# Patient Record
Sex: Male | Born: 1935 | Hispanic: No | Smoking: Former smoker
Health system: Southern US, Community
[De-identification: ages and names within clinical notes are randomized; demographics above are authoritative.]

## PROBLEM LIST (undated history)

## (undated) DIAGNOSIS — I4892 Unspecified atrial flutter: Secondary | ICD-10-CM

## (undated) DIAGNOSIS — I255 Ischemic cardiomyopathy: Secondary | ICD-10-CM

## (undated) DIAGNOSIS — I2510 Atherosclerotic heart disease of native coronary artery without angina pectoris: Secondary | ICD-10-CM

## (undated) DIAGNOSIS — I10 Essential (primary) hypertension: Secondary | ICD-10-CM

## (undated) DIAGNOSIS — E079 Disorder of thyroid, unspecified: Secondary | ICD-10-CM

## (undated) DIAGNOSIS — I509 Heart failure, unspecified: Secondary | ICD-10-CM

## (undated) DIAGNOSIS — K922 Gastrointestinal hemorrhage, unspecified: Secondary | ICD-10-CM

## (undated) DIAGNOSIS — E119 Type 2 diabetes mellitus without complications: Secondary | ICD-10-CM

## (undated) DIAGNOSIS — I471 Supraventricular tachycardia, unspecified: Secondary | ICD-10-CM

## (undated) DIAGNOSIS — I517 Cardiomegaly: Secondary | ICD-10-CM

## (undated) DIAGNOSIS — I4891 Unspecified atrial fibrillation: Secondary | ICD-10-CM

## (undated) DIAGNOSIS — I447 Left bundle-branch block, unspecified: Secondary | ICD-10-CM

## (undated) HISTORY — PX: OTHER SURGICAL HISTORY: SHX169

## (undated) HISTORY — PX: ANGIOPLASTY: SHX39

---

## 2015-01-29 HISTORY — PX: CARDIAC CATHETERIZATION: SHX172

## 2015-05-19 ENCOUNTER — Emergency Department (HOSPITAL_COMMUNITY): Payer: Medicare Other

## 2015-05-19 ENCOUNTER — Encounter (HOSPITAL_COMMUNITY): Payer: Self-pay | Admitting: *Deleted

## 2015-05-19 ENCOUNTER — Observation Stay (HOSPITAL_COMMUNITY)
Admission: EM | Admit: 2015-05-19 | Discharge: 2015-05-20 | Disposition: A | Discharge status: Home / Self Care | Payer: Medicare Other | Attending: Internal Medicine | Admitting: Internal Medicine

## 2015-05-19 DIAGNOSIS — Z7984 Long term (current) use of oral hypoglycemic drugs: Secondary | ICD-10-CM | POA: Insufficient documentation

## 2015-05-19 DIAGNOSIS — I5042 Chronic combined systolic (congestive) and diastolic (congestive) heart failure: Secondary | ICD-10-CM | POA: Diagnosis not present

## 2015-05-19 DIAGNOSIS — E119 Type 2 diabetes mellitus without complications: Secondary | ICD-10-CM | POA: Diagnosis not present

## 2015-05-19 DIAGNOSIS — E039 Hypothyroidism, unspecified: Secondary | ICD-10-CM | POA: Diagnosis not present

## 2015-05-19 DIAGNOSIS — I255 Ischemic cardiomyopathy: Secondary | ICD-10-CM | POA: Insufficient documentation

## 2015-05-19 DIAGNOSIS — I2510 Atherosclerotic heart disease of native coronary artery without angina pectoris: Secondary | ICD-10-CM | POA: Diagnosis not present

## 2015-05-19 DIAGNOSIS — Z87891 Personal history of nicotine dependence: Secondary | ICD-10-CM | POA: Diagnosis not present

## 2015-05-19 DIAGNOSIS — Z7902 Long term (current) use of antithrombotics/antiplatelets: Secondary | ICD-10-CM | POA: Diagnosis not present

## 2015-05-19 DIAGNOSIS — I4891 Unspecified atrial fibrillation: Secondary | ICD-10-CM | POA: Diagnosis not present

## 2015-05-19 DIAGNOSIS — I110 Hypertensive heart disease with heart failure: Secondary | ICD-10-CM | POA: Diagnosis not present

## 2015-05-19 DIAGNOSIS — I447 Left bundle-branch block, unspecified: Secondary | ICD-10-CM | POA: Insufficient documentation

## 2015-05-19 DIAGNOSIS — Z79899 Other long term (current) drug therapy: Secondary | ICD-10-CM | POA: Diagnosis not present

## 2015-05-19 DIAGNOSIS — I509 Heart failure, unspecified: Secondary | ICD-10-CM | POA: Insufficient documentation

## 2015-05-19 DIAGNOSIS — R000 Tachycardia, unspecified: Secondary | ICD-10-CM | POA: Diagnosis present

## 2015-05-19 DIAGNOSIS — Z955 Presence of coronary angioplasty implant and graft: Secondary | ICD-10-CM | POA: Insufficient documentation

## 2015-05-19 DIAGNOSIS — Z8679 Personal history of other diseases of the circulatory system: Secondary | ICD-10-CM

## 2015-05-19 DIAGNOSIS — R002 Palpitations: Secondary | ICD-10-CM | POA: Diagnosis present

## 2015-05-19 HISTORY — DX: Heart failure, unspecified: I50.9

## 2015-05-19 HISTORY — DX: Ischemic cardiomyopathy: I25.5

## 2015-05-19 HISTORY — DX: Atherosclerotic heart disease of native coronary artery without angina pectoris: I25.10

## 2015-05-19 HISTORY — DX: Essential (primary) hypertension: I10

## 2015-05-19 HISTORY — DX: Cardiomegaly: I51.7

## 2015-05-19 HISTORY — DX: Supraventricular tachycardia, unspecified: I47.10

## 2015-05-19 HISTORY — DX: Disorder of thyroid, unspecified: E07.9

## 2015-05-19 HISTORY — DX: Gastrointestinal hemorrhage, unspecified: K92.2

## 2015-05-19 HISTORY — DX: Unspecified atrial fibrillation: I48.91

## 2015-05-19 HISTORY — DX: Left bundle-branch block, unspecified: I44.7

## 2015-05-19 HISTORY — DX: Supraventricular tachycardia: I47.1

## 2015-05-19 HISTORY — DX: Unspecified atrial flutter: I48.92

## 2015-05-19 HISTORY — DX: Type 2 diabetes mellitus without complications: E11.9

## 2015-05-19 LAB — CBC WITH DIFFERENTIAL/PLATELET
Basophils Absolute: 0 10*3/uL (ref 0.0–0.1)
Basophils Relative: 1 %
Eosinophils Absolute: 0.2 10*3/uL (ref 0.0–0.7)
Eosinophils Relative: 3 %
HCT: 42 % (ref 39.0–52.0)
HEMOGLOBIN: 14.4 g/dL (ref 13.0–17.0)
LYMPHS ABS: 2.2 10*3/uL (ref 0.7–4.0)
LYMPHS PCT: 29 %
MCH: 30.4 pg (ref 26.0–34.0)
MCHC: 34.3 g/dL (ref 30.0–36.0)
MCV: 88.8 fL (ref 78.0–100.0)
MONO ABS: 0.4 10*3/uL (ref 0.1–1.0)
MONOS PCT: 6 %
NEUTROS ABS: 4.6 10*3/uL (ref 1.7–7.7)
NEUTROS PCT: 61 %
Platelets: 145 10*3/uL — ABNORMAL LOW (ref 150–400)
RBC: 4.73 MIL/uL (ref 4.22–5.81)
RDW: 16.2 % — AB (ref 11.5–15.5)
WBC: 7.4 10*3/uL (ref 4.0–10.5)

## 2015-05-19 LAB — BRAIN NATRIURETIC PEPTIDE: B Natriuretic Peptide: 689 pg/mL — ABNORMAL HIGH (ref 0.0–100.0)

## 2015-05-19 LAB — COMPREHENSIVE METABOLIC PANEL
ALK PHOS: 62 U/L (ref 38–126)
ALT: 32 U/L (ref 17–63)
ANION GAP: 11 (ref 5–15)
AST: 40 U/L (ref 15–41)
Albumin: 4 g/dL (ref 3.5–5.0)
BUN: 24 mg/dL — ABNORMAL HIGH (ref 6–20)
CALCIUM: 9.2 mg/dL (ref 8.9–10.3)
CO2: 26 mmol/L (ref 22–32)
Chloride: 103 mmol/L (ref 101–111)
Creatinine, Ser: 1 mg/dL (ref 0.61–1.24)
GFR calc Af Amer: >60 mL/min (ref >60)
GLUCOSE: 183 mg/dL — AB (ref 65–99)
Potassium: 3.8 mmol/L (ref 3.5–5.1)
Sodium: 140 mmol/L (ref 135–145)
TOTAL PROTEIN: 6.5 g/dL (ref 6.5–8.1)
Total Bilirubin: 1 mg/dL (ref 0.3–1.2)

## 2015-05-19 LAB — MAGNESIUM: MAGNESIUM: 1.6 mg/dL — AB (ref 1.7–2.4)

## 2015-05-19 LAB — TROPONIN I: TROPONIN I: 0.04 ng/mL — AB (ref <0.031)

## 2015-05-19 MED ORDER — AMIODARONE HCL IN DEXTROSE 360-4.14 MG/200ML-% IV SOLN
60.0000 mg/h | INTRAVENOUS | Status: DC
  Filled 2015-05-19: qty 200

## 2015-05-19 MED ORDER — MAGNESIUM SULFATE 2 GM/50ML IV SOLN
2.0000 g | Freq: Once | INTRAVENOUS | Status: AC
  Administered 2015-05-19: 2 g via INTRAVENOUS
  Filled 2015-05-19: qty 50

## 2015-05-19 MED ORDER — AMIODARONE HCL IN DEXTROSE 360-4.14 MG/200ML-% IV SOLN: 30.0000 mg/h | INTRAVENOUS | Status: DC

## 2015-05-19 MED ORDER — AMIODARONE LOAD VIA INFUSION
150.0000 mg | Freq: Once | INTRAVENOUS | Status: DC
  Filled 2015-05-19: qty 83.34

## 2015-05-19 NOTE — ED Provider Notes (Signed)
CSN: [id number]     Arrival date & time 05/19/15  1954 History   First MD Initiated Contact with Patient 05/19/15 2014     Chief Complaint  Patient presents with  . Chest Pain     HPI  Pt was seen at 2015. Per pt, c/o gradual onset and persistence of constant "funny sensation" in his mid-chest since approximately 1830 PTA. Has been associated with diaphoresis and SOB. Pt states he was evaluated at a local [facility], and was told to go to a local ED for further evaluation. Pt endorses hx CHF and afib.  Denies chest pain, no palpitations, no cough, no back pain, no abd pain, no N/V/D, no fevers.    Receives care at [facility] in [place] Past Medical History  Diagnosis Date  . CHF (congestive heart failure) (HCC)   . Hypertension   . Diabetes mellitus without complication (HCC)   . Thyroid disease   . Atrial fibrillation (HCC)   . History of Wolff-Parkinson-White (WPW) syndrome     s/p ablation  . Coronary artery disease   . GI bleed    Past Surgical History  Procedure Laterality Date  . Angioplasty    . Heart ablation      for WPW  . Stents    . Cardiac catheterization  01/2015    [name]    Social History  Substance Use Topics  . Smoking status: Former Games developer  . Smokeless tobacco: None  . Alcohol Use: No    Review of Systems ROS: Statement: All systems negative except as marked or noted in the HPI; Constitutional: Negative for fever and chills. ; ; Eyes: Negative for eye pain, redness and discharge. ; ; ENMT: Negative for ear pain, hoarseness, nasal congestion, sinus pressure and sore throat. ; ; Cardiovascular: +SOB, diaphoresis. Negative for chest pain, palpitations, and peripheral edema. ; ; Respiratory: Negative for cough, wheezing and stridor. ; ; Gastrointestinal: Negative for nausea, vomiting, diarrhea, abdominal pain, blood in stool, hematemesis, jaundice and rectal bleeding. . ; ; Genitourinary: Negative for dysuria, flank pain and hematuria. ; ;  Musculoskeletal: Negative for back pain and neck pain. Negative for swelling and trauma.; ; Skin: Negative for pruritus, rash, abrasions, blisters, bruising and skin lesion.; ; Neuro: Negative for headache, lightheadedness and neck stiffness. Negative for weakness, altered level of consciousness , altered mental status, extremity weakness, paresthesias, involuntary movement, seizure and syncope.      Allergies  Pradaxa and Sulfa antibiotics  Home Medications   Prior to Admission medications   Medication Sig Start Date End Date Taking? Authorizing Provider  amLODipine (NORVASC) 2.5 MG tablet Take 2.5 mg by mouth every morning.   Yes Historical Provider, MD  atorvastatin (LIPITOR) 40 MG tablet Take 40 mg by mouth every evening.   Yes Historical Provider, MD  clopidogrel (PLAVIX) 75 MG tablet Take 75 mg by mouth every morning.   Yes Historical Provider, MD  furosemide (LASIX) 40 MG tablet Take 40 mg by mouth 2 (two) times daily.   Yes Historical Provider, MD  isosorbide mononitrate (IMDUR) 60 MG 24 hr tablet Take 60 mg by mouth every morning.   Yes Historical Provider, MD  levothyroxine (SYNTHROID, LEVOTHROID) 100 MCG tablet Take 100 mcg by mouth daily before breakfast.   Yes Historical Provider, MD  lisinopril (PRINIVIL,ZESTRIL) 40 MG tablet Take 40 mg by mouth every morning.   Yes Historical Provider, MD  metFORMIN (GLUCOPHAGE) 500 MG tablet Take 500 mg by mouth 2 (two) times daily  with a meal.   Yes Historical Provider, MD  metoprolol (LOPRESSOR) 100 MG tablet Take 100 mg by mouth 2 (two) times daily.   Yes Historical Provider, MD  Multiple Vitamins-Minerals (CENTRUM PO) Take 1 tablet by mouth daily.   Yes Historical Provider, MD  vitamin C (ASCORBIC ACID) 500 MG tablet Take 500 mg by mouth daily.   Yes Historical Provider, MD   BP 119/69 mmHg  Pulse 72  Temp(Src) 97.6 F (36.4 C) (Oral)  Resp 19  Ht 5\' 9"  (1.753 m)  Wt 155 lb (70.308 kg)  BMI 22.88 kg/m2  SpO2 100% Physical Exam   2020: Physical examination:  Nursing notes reviewed; Vital signs and O2 SAT reviewed;  Constitutional: Well developed, Well nourished, Well hydrated, In no acute distress; Head:  Normocephalic, atraumatic; Eyes: EOMI, PERRL, No scleral icterus; ENMT: Mouth and pharynx normal, Mucous membranes moist; Neck: Supple, Full range of motion, No lymphadenopathy; Cardiovascular: Tachycardic rate and irregular rhythm, No gallop; Respiratory: Breath sounds clear & equal bilaterally, No wheezes.  Speaking full sentences with ease, Normal respiratory effort/excursion; Chest: Nontender, Movement normal; Abdomen: Soft, Nontender, Nondistended, Normal bowel sounds; Genitourinary: No CVA tenderness; Extremities: Pulses normal, No tenderness, +1 pedal edema bilat. No calf asymmetry.; Neuro: AA&Ox3, Major CN grossly intact.  Speech clear. No gross focal motor or sensory deficits in extremities.; Skin: Color normal, Warm, Dry.   ED Course  Procedures (including critical care time) Labs Review   Imaging Review  I have personally reviewed and evaluated these images and lab results as part of my medical decision-making.   EKG Interpretation   Date/Time:  Friday May 19 2015 20:07:11 EDT Ventricular Rate:  178 PR Interval:  143 QRS Duration: 149 QT Interval:  297 QTC Calculation: 511 R Axis:   -79 Text Interpretation:  Extreme tachycardia with wide complex, no further  rhythm analysis attempted No old tracing to compare Confirmed by [facility]   MD, Nicholos JohnsKATHLEEN [phone]([id number]) on 05/19/2015 10:17:56 PM      EKG Interpretation  Date/Time:  Friday May 19 2015 20:16:32 EDT Ventricular Rate:  105 PR Interval:  57 QRS Duration: 149 QT Interval:  395 QTC Calculation: 522 R Axis:   -57 Text Interpretation:  Atrial fibrillation Premature ventricular complexes Short PR interval Consider right atrial enlargement Left axis deviation Left bundle branch block Since last tracing of earlier today Rate slower Confirmed by  [facility]  MD, Nicholos JohnsKATHLEEN [phone]) on 05/19/2015 10:19:13 PM        MDM  MDM Reviewed: previous chart, nursing note and vitals Interpretation: labs, ECG and x-ray Total time providing critical care: 30-74 minutes. This excludes time spent performing separately reportable procedures and services. Consults: admitting MD     CRITICAL CARE Performed by: Laray AngerMCMANUS,Haislee Corso M Total critical care time: 35 minutes Critical care time was exclusive of separately billable procedures and treating other patients. Critical care was necessary to treat or prevent imminent or life-threatening deterioration. Critical care was time spent personally by me on the following activities: development of treatment plan with patient and/or surrogate as well as nursing, discussions with consultants, evaluation of patient's response to treatment, examination of patient, obtaining history from patient or surrogate, ordering and performing treatments and interventions, ordering and review of laboratory studies, ordering and review of radiographic studies, pulse oximetry and re-evaluation of patient's condition.   Results for orders placed or performed during the hospital encounter of 05/19/15  CBC with Differential  Result Value Ref Range   WBC 7.4 4.0 - 10.5 K/uL  RBC 4.73 4.22 - 5.81 MIL/uL   Hemoglobin 14.4 13.0 - 17.0 g/dL   HCT 16.1 09.6 - 04.5 %   MCV 88.8 78.0 - 100.0 fL   MCH 30.4 26.0 - 34.0 pg   MCHC 34.3 30.0 - 36.0 g/dL   RDW 40.9 (H) 81.1 - 91.4 %   Platelets 145 (L) 150 - 400 K/uL   Neutrophils Relative % 61 %   Neutro Abs 4.6 1.7 - 7.7 K/uL   Lymphocytes Relative 29 %   Lymphs Abs 2.2 0.7 - 4.0 K/uL   Monocytes Relative 6 %   Monocytes Absolute 0.4 0.1 - 1.0 K/uL   Eosinophils Relative 3 %   Eosinophils Absolute 0.2 0.0 - 0.7 K/uL   Basophils Relative 1 %   Basophils Absolute 0.0 0.0 - 0.1 K/uL  Comprehensive metabolic panel  Result Value Ref Range   Sodium 140 135 - 145 mmol/L   Potassium  3.8 3.5 - 5.1 mmol/L   Chloride 103 101 - 111 mmol/L   CO2 26 22 - 32 mmol/L   Glucose, Bld 183 (H) 65 - 99 mg/dL   BUN 24 (H) 6 - 20 mg/dL   Creatinine, Ser 7.82 0.61 - 1.24 mg/dL   Calcium 9.2 8.9 - 95.6 mg/dL   Total Protein 6.5 6.5 - 8.1 g/dL   Albumin 4.0 3.5 - 5.0 g/dL   AST 40 15 - 41 U/L   ALT 32 17 - 63 U/L   Alkaline Phosphatase 62 38 - 126 U/L   Total Bilirubin 1.0 0.3 - 1.2 mg/dL   GFR calc non Af Amer >60 >60 mL/min   GFR calc Af Amer >60 >60 mL/min   Anion gap 11 5 - 15  Troponin I  Result Value Ref Range   Troponin I 0.04 (H) <0.031 ng/mL  Brain natriuretic peptide  Result Value Ref Range   B Natriuretic Peptide 689.0 (H) 0.0 - 100.0 pg/mL  Magnesium  Result Value Ref Range   Magnesium 1.6 (L) 1.7 - 2.4 mg/dL    Dg Chest Port 1 View 05/19/2015  CLINICAL DATA:  Palpitations and diaphoresis. EXAM: PORTABLE CHEST 1 VIEW COMPARISON:  None. FINDINGS: Slightly shallow inspiration. Cardiac enlargement without vascular congestion. No focal airspace disease or consolidation in the lungs. No blunting of costophrenic angles. No pneumothorax. Calcification of the aorta. IMPRESSION: Cardiac enlargement.  No evidence of active pulmonary disease. Electronically Signed   By: Burman Nieves M.D.   On: 05/19/2015 22:24    2230:  On arrival: pt's with wide complex tachycardia, LBBB, no old EKG to compare. As I was evaluating pt, his monitor spontaneously changed to afib with PVC's, rate 80's. Pt stated he "felt better" after this. Pt's monitor again briefly again showed HR 120's, wide complex tachycardia; but this also quickly spontaneously resolved once again before any meds were given. Pt has remained afib, rate 70-80's since this time. Continues to endorse he "feels better."  IV magnesium given for low magnesium level.  Pt already has taken plavix today (is only on plavix due to hx GI bleed per Care Everywhere records). Dx and testing d/w pt and family.  Questions answered.  Verb  understanding, agreeable to admit.  T/C to [facility] Dr. Onalee Hua, case discussed, including:  HPI, pertinent PM/SHx, VS/PE, dx testing, ED course and treatment:  Request to call [facility] Cards MD to admit. T/C to [facility] Cards Fellow Dr. Sandy Salaam, case discussed, including:  HPI, pertinent PM/SHx, VS/PE, dx testing, ED course and treatment:  requests to have [facility] admit to [facility] under their service and Cards will consult; he has found in Care Everywhere records that LBBB is pt's baseline. T/C to [facility] Dr. Onalee Hua, case discussed, including:  HPI, pertinent PM/SHx, VS/PE, dx testing, ED course and treatment:  Agreeable to facilitate transfer/admit to [facility], requests to write temporary orders, obtain observation tele bed to team [facility].    Samuel Jester, DO 05/22/15 0107

## 2015-05-19 NOTE — ED Notes (Signed)
Daughter Cristino Marteserry Witt[place] cell [phone].

## 2015-05-19 NOTE — ED Notes (Signed)
[place] today and pt reports that about 7pm he began having a "funny" sensation in the center of his chest and diaphoretic. Pt reports he is feeling better now but had stopped at an Urgent Care and they recommended him to come to [facility] ED.

## 2015-05-20 ENCOUNTER — Encounter (HOSPITAL_COMMUNITY): Payer: Self-pay | Admitting: Internal Medicine

## 2015-05-20 DIAGNOSIS — I471 Supraventricular tachycardia: Secondary | ICD-10-CM | POA: Diagnosis not present

## 2015-05-20 DIAGNOSIS — R002 Palpitations: Secondary | ICD-10-CM

## 2015-05-20 DIAGNOSIS — E039 Hypothyroidism, unspecified: Secondary | ICD-10-CM | POA: Diagnosis not present

## 2015-05-20 DIAGNOSIS — I447 Left bundle-branch block, unspecified: Secondary | ICD-10-CM | POA: Diagnosis not present

## 2015-05-20 DIAGNOSIS — I4892 Unspecified atrial flutter: Secondary | ICD-10-CM | POA: Diagnosis not present

## 2015-05-20 DIAGNOSIS — E119 Type 2 diabetes mellitus without complications: Secondary | ICD-10-CM

## 2015-05-20 DIAGNOSIS — I481 Persistent atrial fibrillation: Secondary | ICD-10-CM

## 2015-05-20 DIAGNOSIS — I25119 Atherosclerotic heart disease of native coronary artery with unspecified angina pectoris: Secondary | ICD-10-CM

## 2015-05-20 DIAGNOSIS — Z87891 Personal history of nicotine dependence: Secondary | ICD-10-CM | POA: Diagnosis not present

## 2015-05-20 DIAGNOSIS — I10 Essential (primary) hypertension: Secondary | ICD-10-CM | POA: Diagnosis not present

## 2015-05-20 DIAGNOSIS — R000 Tachycardia, unspecified: Secondary | ICD-10-CM | POA: Diagnosis present

## 2015-05-20 DIAGNOSIS — Z7902 Long term (current) use of antithrombotics/antiplatelets: Secondary | ICD-10-CM | POA: Diagnosis not present

## 2015-05-20 DIAGNOSIS — I4891 Unspecified atrial fibrillation: Secondary | ICD-10-CM | POA: Diagnosis not present

## 2015-05-20 DIAGNOSIS — Z79899 Other long term (current) drug therapy: Secondary | ICD-10-CM | POA: Diagnosis not present

## 2015-05-20 DIAGNOSIS — I110 Hypertensive heart disease with heart failure: Secondary | ICD-10-CM | POA: Diagnosis not present

## 2015-05-20 DIAGNOSIS — I255 Ischemic cardiomyopathy: Secondary | ICD-10-CM | POA: Diagnosis not present

## 2015-05-20 DIAGNOSIS — I5042 Chronic combined systolic (congestive) and diastolic (congestive) heart failure: Secondary | ICD-10-CM | POA: Diagnosis not present

## 2015-05-20 DIAGNOSIS — Z955 Presence of coronary angioplasty implant and graft: Secondary | ICD-10-CM | POA: Diagnosis not present

## 2015-05-20 DIAGNOSIS — Z7984 Long term (current) use of oral hypoglycemic drugs: Secondary | ICD-10-CM | POA: Diagnosis not present

## 2015-05-20 DIAGNOSIS — I2510 Atherosclerotic heart disease of native coronary artery without angina pectoris: Secondary | ICD-10-CM | POA: Diagnosis not present

## 2015-05-20 LAB — URINALYSIS, ROUTINE W REFLEX MICROSCOPIC
Bilirubin Urine: NEGATIVE
GLUCOSE, UA: 250 mg/dL — AB
HGB URINE DIPSTICK: NEGATIVE
Ketones, ur: NEGATIVE mg/dL
Leukocytes, UA: NEGATIVE
Nitrite: NEGATIVE
PH: 5.5 (ref 5.0–8.0)
Protein, ur: 30 mg/dL — AB
SPECIFIC GRAVITY, URINE: 1.026 (ref 1.005–1.030)

## 2015-05-20 LAB — GLUCOSE, CAPILLARY
GLUCOSE-CAPILLARY: 95 mg/dL (ref 65–99)
Glucose-Capillary: 134 mg/dL — ABNORMAL HIGH (ref 65–99)
Glucose-Capillary: 129 mg/dL — ABNORMAL HIGH (ref 65–99)

## 2015-05-20 LAB — COMPREHENSIVE METABOLIC PANEL
ALBUMIN: 3.7 g/dL (ref 3.5–5.0)
ALT: 28 U/L (ref 17–63)
ANION GAP: 12 (ref 5–15)
AST: 32 U/L (ref 15–41)
Alkaline Phosphatase: 60 U/L (ref 38–126)
BILIRUBIN TOTAL: 1 mg/dL (ref 0.3–1.2)
BUN: 17 mg/dL (ref 6–20)
CO2: 26 mmol/L (ref 22–32)
Calcium: 8.6 mg/dL — ABNORMAL LOW (ref 8.9–10.3)
Chloride: 103 mmol/L (ref 101–111)
Creatinine, Ser: 0.88 mg/dL (ref 0.61–1.24)
GFR calc Af Amer: >60 mL/min (ref >60)
GFR calc non Af Amer: >60 mL/min (ref >60)
GLUCOSE: 146 mg/dL — AB (ref 65–99)
POTASSIUM: 3.6 mmol/L (ref 3.5–5.1)
Sodium: 141 mmol/L (ref 135–145)
TOTAL PROTEIN: 6 g/dL — AB (ref 6.5–8.1)

## 2015-05-20 LAB — URINE MICROSCOPIC-ADD ON

## 2015-05-20 LAB — TROPONIN I
TROPONIN I: 0.11 ng/mL — AB (ref <0.031)
TROPONIN I: 0.1 ng/mL — AB (ref <0.031)
Troponin I: 0.1 ng/mL — ABNORMAL HIGH (ref <0.031)

## 2015-05-20 LAB — TSH: TSH: 5.412 u[IU]/mL — ABNORMAL HIGH (ref 0.350–4.500)

## 2015-05-20 LAB — MAGNESIUM: Magnesium: 1.9 mg/dL (ref 1.7–2.4)

## 2015-05-20 MED ORDER — LEVOTHYROXINE SODIUM 100 MCG PO TABS
100.0000 ug | ORAL_TABLET | Freq: Every day | ORAL | Status: DC
  Administered 2015-05-20: 100 ug via ORAL
  Filled 2015-05-20: qty 1

## 2015-05-20 MED ORDER — ACETAMINOPHEN 325 MG PO TABS: 650.0000 mg | ORAL_TABLET | ORAL | Status: DC | PRN

## 2015-05-20 MED ORDER — INSULIN ASPART 100 UNIT/ML ~~LOC~~ SOLN
0.0000 [IU] | Freq: Three times a day (TID) | SUBCUTANEOUS | Status: DC
  Administered 2015-05-20: 1 [IU] via SUBCUTANEOUS

## 2015-05-20 MED ORDER — POTASSIUM CHLORIDE CRYS ER 20 MEQ PO TBCR
30.0000 meq | EXTENDED_RELEASE_TABLET | Freq: Once | ORAL | Status: AC
  Administered 2015-05-20: 30 meq via ORAL
  Filled 2015-05-20: qty 1

## 2015-05-20 MED ORDER — ATORVASTATIN CALCIUM 40 MG PO TABS: 40.0000 mg | ORAL_TABLET | Freq: Every evening | ORAL | Status: DC

## 2015-05-20 MED ORDER — ASPIRIN EC 325 MG PO TBEC
325.0000 mg | DELAYED_RELEASE_TABLET | Freq: Every day | ORAL | Status: DC
  Administered 2015-05-20: 325 mg via ORAL
  Filled 2015-05-20: qty 1

## 2015-05-20 MED ORDER — ONDANSETRON HCL 4 MG/2ML IJ SOLN: 4.0000 mg | Freq: Four times a day (QID) | INTRAMUSCULAR | Status: DC | PRN

## 2015-05-20 MED ORDER — LISINOPRIL 40 MG PO TABS
40.0000 mg | ORAL_TABLET | Freq: Every morning | ORAL | Status: DC
  Administered 2015-05-20: 40 mg via ORAL
  Filled 2015-05-20: qty 1

## 2015-05-20 MED ORDER — FUROSEMIDE 40 MG PO TABS
40.0000 mg | ORAL_TABLET | Freq: Two times a day (BID) | ORAL | Status: DC
  Administered 2015-05-20 (×2): 40 mg via ORAL
  Filled 2015-05-20 (×2): qty 1

## 2015-05-20 MED ORDER — AMIODARONE HCL 200 MG PO TABS: 200.0000 mg | ORAL_TABLET | Freq: Every day | ORAL | Status: AC

## 2015-05-20 MED ORDER — SODIUM CHLORIDE 0.9 % IV SOLN
INTRAVENOUS | Status: AC
  Administered 2015-05-20: 03:00:00 via INTRAVENOUS

## 2015-05-20 MED ORDER — VITAMIN C 500 MG PO TABS
500.0000 mg | ORAL_TABLET | Freq: Every day | ORAL | Status: DC
  Administered 2015-05-20: 500 mg via ORAL
  Filled 2015-05-20: qty 1

## 2015-05-20 MED ORDER — OFF THE BEAT BOOK
Freq: Once | Status: DC
  Filled 2015-05-20: qty 1

## 2015-05-20 MED ORDER — METOPROLOL TARTRATE 100 MG PO TABS
100.0000 mg | ORAL_TABLET | Freq: Two times a day (BID) | ORAL | Status: DC
  Administered 2015-05-20 (×2): 100 mg via ORAL
  Filled 2015-05-20 (×2): qty 1

## 2015-05-20 MED ORDER — AMLODIPINE BESYLATE 5 MG PO TABS
2.5000 mg | ORAL_TABLET | Freq: Every morning | ORAL | Status: DC
  Administered 2015-05-20: 2.5 mg via ORAL
  Filled 2015-05-20: qty 1

## 2015-05-20 MED ORDER — CLOPIDOGREL BISULFATE 75 MG PO TABS
75.0000 mg | ORAL_TABLET | Freq: Every morning | ORAL | Status: DC
  Administered 2015-05-20: 75 mg via ORAL
  Filled 2015-05-20: qty 1

## 2015-05-20 MED ORDER — ISOSORBIDE MONONITRATE ER 60 MG PO TB24
60.0000 mg | ORAL_TABLET | Freq: Every morning | ORAL | Status: DC
Start: 2015-05-20 — End: 2015-05-20
  Administered 2015-05-20: 60 mg via ORAL
  Filled 2015-05-20: qty 1

## 2015-05-20 NOTE — Progress Notes (Signed)
Order received to discharge.  Telemetry removed and CCMD notified.  IV removed with catheter intact.  Discharge education given to Pt with daughter at bedside.  Pt given educational booklet Off the Beat for diagnosis of Afib.  Pt to start on amiodarone and educational handout given on amiodarone.  Pt indicates understanding of presented materials.  Pt denies chest discomfort at this time.  Pt stable at discharge.

## 2015-05-20 NOTE — H&P (Signed)
PCP:   Pcp Not In System   Chief Complaint:  fluttering  HPI: 82 yo male h/o afib/LBBB, chf, htn, hypothyroidism, s/p ablation for unknown arrythmia (pt not aware of dx of WPW) comes in today after experiencing a fluttering sensation in his chest with associated diaphoresis and sob.  There was really no chest pain.  [place] from [place].  He has experienced theses symptoms before and he says it is due to his irregular heartbeat.  Pt HR was over 160, spontaneously resolved in the ED and rate now in the 80s pt asymptomatic at this time.  Pt denies any fever.  No swelling.  Pt referred for admission for his arrythmia.  [facility], [facility], pt aware of this.  Review of Systems:  Positive and negative as per HPI otherwise all other systems are negative  Past Medical History: Past Medical History  Diagnosis Date  . CHF (congestive heart failure) (HCC)   . Hypertension   . Diabetes mellitus without complication (HCC)   . Thyroid disease   . Atrial fibrillation (HCC)   . History of Wolff-Parkinson-White (WPW) syndrome     s/p ablation  . Coronary artery disease   . GI bleed    Past Surgical History  Procedure Laterality Date  . Angioplasty    . Heart ablation      for WPW  . Stents    . Cardiac catheterization  01/2015    [name]    Medications: Prior to Admission medications   Medication Sig Start Date End Date Taking? Authorizing Provider  amLODipine (NORVASC) 2.5 MG tablet Take 2.5 mg by mouth every morning.   Yes Historical Provider, MD  atorvastatin (LIPITOR) 40 MG tablet Take 40 mg by mouth every evening.   Yes Historical Provider, MD  clopidogrel (PLAVIX) 75 MG tablet Take 75 mg by mouth every morning.   Yes Historical Provider, MD  furosemide (LASIX) 40 MG tablet Take 40 mg by mouth 2 (two) times daily.   Yes Historical Provider, MD  isosorbide mononitrate (IMDUR) 60 MG 24 hr tablet Take 60 mg by  mouth every morning.   Yes Historical Provider, MD  levothyroxine (SYNTHROID, LEVOTHROID) 100 MCG tablet Take 100 mcg by mouth daily before breakfast.   Yes Historical Provider, MD  lisinopril (PRINIVIL,ZESTRIL) 40 MG tablet Take 40 mg by mouth every morning.   Yes Historical Provider, MD  metFORMIN (GLUCOPHAGE) 500 MG tablet Take 500 mg by mouth 2 (two) times daily with a meal.   Yes Historical Provider, MD  metoprolol (LOPRESSOR) 100 MG tablet Take 100 mg by mouth 2 (two) times daily.   Yes Historical Provider, MD  Multiple Vitamins-Minerals (CENTRUM PO) Take 1 tablet by mouth daily.   Yes Historical Provider, MD  vitamin C (ASCORBIC ACID) 500 MG tablet Take 500 mg by mouth daily.   Yes Historical Provider, MD    Allergies:   Allergies  Allergen Reactions  . Pradaxa [Dabigatran Etexilate Mesylate] Other (See Comments)    black-tar stools  . Sulfa Antibiotics Hives and Rash    Social History:  reports that he has quit smoking. He does not have any smokeless tobacco history on file. He reports that he does not drink alcohol or use illicit drugs.  Family History: History reviewed. No pertinent family history.  Physical Exam: Filed Vitals:   05/19/15 2230 05/19/15 2300 05/19/15 2330 05/20/15 0000  BP: 141/68 131/64 125/79 140/89  Pulse:  52 88 88 88  Temp:      TempSrc:      Resp: Height:      Weight:      SpO2: 96% 97% 96% 97%   General appearance: alert, cooperative and no distress Head: Normocephalic, without obvious abnormality, atraumatic Eyes: negative Nose: Nares normal. Septum midline. Mucosa normal. No drainage or sinus tenderness. Neck: no JVD and supple, symmetrical, trachea midline Lungs: clear to auscultation bilaterally Heart: regular rate and rhythm, S1, S2 normal, no murmur, click, rub or gallop Abdomen: soft, non-tender; bowel sounds normal; no masses,  no organomegaly Extremities: extremities normal, atraumatic, no cyanosis or edema Pulses: 2+  and symmetric Skin: Skin color, texture, turgor normal. No rashes or lesions Neurologic: Grossly normal    Labs on Admission:   Recent Labs  05/19/15 2026  NA 140  K 3.8  CL 103  CO2 26  GLUCOSE 183*  BUN 24*  CREATININE 1.00  CALCIUM 9.2  MG 1.6*    Recent Labs  05/19/15 2026  AST 40  ALT 32  ALKPHOS 62  BILITOT 1.0  PROT 6.5  ALBUMIN 4.0    Recent Labs  05/19/15 2026  WBC 7.4  NEUTROABS 4.6  HGB 14.4  HCT 42.0  MCV 88.8  PLT 145*    Recent Labs  05/19/15 2026  TROPONINI 0.04*   Radiological Exams on Admission: Dg Chest Port 1 View  05/19/2015  CLINICAL DATA:  Palpitations and diaphoresis. EXAM: PORTABLE CHEST 1 VIEW COMPARISON:  None. FINDINGS: Slightly shallow inspiration. Cardiac enlargement without vascular congestion. No focal airspace disease or consolidation in the lungs. No blunting of costophrenic angles. No pneumothorax. Calcification of the aorta. IMPRESSION: Cardiac enlargement.  No evidence of active pulmonary disease. Electronically Signed   By: Burman Nieves M.D.   On: 05/19/2015 22:24   ekg afib, lbbb  Assessment/Plan  82 yo male with fluttering, mild elevation of troponin h/o afib and lbbb  Principal Problem:   Fluttering sensation of heart- likely afib with rvr.  Rate controlled now.  Will check tsh level.  Pt with low mag, replete.  [facility] for [facility].  Cardiology aware and will be notified upon pt arrival to [facility] tele floor.  Serial trop as mildly elevated.  Aspirin.    Active Problems:   CHF (congestive heart failure) (HCC)- compensated at this time   Diabetes mellitus without complication (HCC)- ssi   Atrial fibrillation (HCC)- noted, rate good control at this moment   History of Wolff-Parkinson-White (WPW) syndrome- pt unaware of this diagnosis   Atrial fibrillation with rapid ventricular response (HCC)   Hypomagnesemia- replete  obs on tele.  Full code.  Elanora Quin A 05/20/2015,  12:35 AM

## 2015-05-20 NOTE — Progress Notes (Signed)
Patient seen and examined this morning, H&P reviewed and cardiology consult note. Briefly this is a pleasant and highly functioning 82 yo M with history of CAD, chronic systolic CHF, PAF not anticoagulated due to GI bleeding, admitted with palpitations, lightheadedness, dyspnea and chest discomfort. [facility] and [facility]    Chest pain / WCT / A fib / flutter / CAD - cardiology to see, appreciate input - patient asymptomatic this morning - bradycardic episode overnight, asymptomatic - troponins essentially flat - continue Aspirin / Plavix - CHADs2VASc score of 6, prior GI bleed  HLD - continue Lipitor  Chronic systolic CHF - compensated, continue ACEI, Metoprolol  DM - SSI  Morning labs pending  Irisa Grimsley M. Elvera LennoxGherghe, MD [facility] [phone]

## 2015-05-20 NOTE — Consult Note (Signed)
ELECTROPHYSIOLOGY CONSULT NOTE    Primary Care Physician: Pcp Not In System Referring Physician:  Dr Elvera Lennox  Admit Date: 05/19/2015  Reason for consultation:  WCT   James Evans is a 82 y.o. male with a h/o chronic systolic dysfunction, severe LA enlargement, atrial fibrillation, and LBBB.  He is admitted with atrial flutter with RVR.  He reports having an ablation (does not know what kind) about 5 years ago.  He has done well but carries a h/o afib.  He tried pradaxa previously in the setting of antiplatelet therapy and had GI bleeding.  Pradaxa was stopped and he was continued on antiplatelet therapy.  He appears to be in afib most of the time.  He is traveling to [facility] from [place] to visit his family.  He is very functional.  He reports that yesterday he had palpitations and an "awareness" in his chest.  He has associated SOB.  He did not have CP.   He presented to [facility]. In the ED at that facility he was noted to have intermittent runs of WCT, some with HR>180bpm, and with recurrent symptoms during these times. He was otherwise hemodynamically stable and asymptomatic, but given his cardiac history he was transferred to [facility] for further evaluation.  Mr. Wiswell is currently resting comfortably in bed and has no acute complaints. He denies any recent presyncopal or syncopal events.  He is now back to his baseline health state.  Today, he denies symptoms of palpitations, chest pain, shortness of breath, orthopnea, PND, lower extremity edema, dizziness, presyncope, syncope, or neurologic sequela. The patient is tolerating medications without difficulties and is otherwise without complaint today.   Past Medical History  Diagnosis Date  . CHF (congestive heart failure) (HCC)   . Hypertension   . Diabetes mellitus without complication (HCC)   . Thyroid disease   . Atrial fibrillation (HCC)   . History of Wolff-Parkinson-White (WPW) syndrome     s/p  ablation  . Coronary artery disease   . GI bleed    Past Surgical History  Procedure Laterality Date  . Angioplasty    . Heart ablation      for WPW  . Stents    . Cardiac catheterization  01/2015    [place]    . amLODipine  2.5 mg Oral q morning - 10a  . atorvastatin  40 mg Oral QPM  . clopidogrel  75 mg Oral q morning - 10a  . furosemide  40 mg Oral BID  . insulin aspart  0-9 Units Subcutaneous TID WC  . isosorbide mononitrate  60 mg Oral q morning - 10a  . levothyroxine  100 mcg Oral QAC breakfast  . lisinopril  40 mg Oral q morning - 10a  . metoprolol  100 mg Oral BID  . vitamin C  500 mg Oral Daily      Allergies  Allergen Reactions  . Pradaxa [Dabigatran Etexilate Mesylate] Other (See Comments)    black-tar stools  . Sulfa Antibiotics Hives and Rash    Social History   Social History  . Marital Status: Widowed    Spouse Name: N/A  . Number of Children: N/A  . Years of Education: N/A   Occupational History  . Not on file.   Social History Main Topics  . Smoking status: Former Games developer  . Smokeless tobacco: Not on file  . Alcohol Use: No  . Drug Use: No  . Sexual Activity: Not on file   Other  Topics Concern  . Not on file   Social History Narrative  . No narrative on file   FH- HTN  ROS- All systems are reviewed and negative except as per the HPI above  Physical Exam: Telemetry: Filed Vitals:   05/20/15 0000 05/20/15 0030 05/20/15 0206 05/20/15 1247  BP: 140/89 151/84 154/83 115/82  Pulse: 88 87 87 76  Temp:   97.3 F (36.3 C) 98.7 F (37.1 C)  TempSrc:   Oral Oral  Resp: Height:    (1.753 m)   Weight:   153 lb 14.4 oz (69.809 kg)   SpO2: 97% 96% 98% 100%    GEN- The patient is well appearing, alert and oriented x 3 today.   Head- normocephalic, atraumatic Eyes-  Sclera clear, conjunctiva pink Ears- hearing intact Oropharynx- clear Neck- supple,   Lungs- Clear to ausculation bilaterally, normal work of  breathing Heart- irregular rate and rhythm, no murmurs, rubs or gallops, PMI not laterally displaced GI- soft, NT, ND, + BS Extremities- no clubbing, cyanosis, or edema MS- no significant deformity or atrophy Skin- no rash or lesion Psych- euthymic mood, full affect Neuro- strength and sensation are intact   Echo [employer] [facility]) 02/11/2014:  Moderately dilated left ventricle. Severe left ventricular dysfuncion. Estimated ejection fraction is (conduction delay). Overall- there is a severe global hypokinesis. Mild concentric left ventricular hypertrophy. Severe left atrial enlargement. .Right ventricle and right atrium are mildly enlarged . Right ventricle is mildly hypokinetic. Mitral valve leaflets are thickened. There is a moderate, centrally directing mitral regurgitation. Calcified aortic valve with a moderate aortic regurgitation. Moderate tricuspid and pulmonic regurgitation. Moderate pulmonary hypertension. Aortic root and ascending aorta are mildly enlarged. Mild ascites noted.    Coronary Angiography [facility]) 02/10/2015 Coronary Angiography:   1. Left Main Trunk (LMT): Large caliber vessel with mild calcification and diffuse 10% distal stenosis  2. Left Anterior Descending Artery (LAD): Medium caliber calcified vessel with proximal diffuse 20-30% stenosis. The mid LAD has a widely patent previously placed stent with mild in-stent restenosis. The LAD beyond the stent has a discrete 40% stenosis followed by luminal irregularities in a transapical vessel. Diagonal 1 is a small caliber branching vessel that is jailed by the previous placed stent and has a severe 80% ostial stenosis.   3. Left Circumflex Artery (LCx): Medium caliber calcified proximal vessel with diffuse stenosis ranging from 20-40%. Just distal to the takeoff of OM 2 in the AV groove portion of the circumflex there is a discrete 50-60% stenosis. OM 2 is a medium caliber branching  vessel with luminal irregularities.  4. Right Coronary Artery (RCA): Proximal to mid vessel has mild calcification with diffuse 10-30% stenosis. The RPDA is small caliber with luminal irregularities throughout   EKG reveal typical appearing atrial flutter with RVR, LBBB  Labs:   Lab Results  Component Value Date   WBC 7.4 05/19/2015   HGB 14.4 05/19/2015   HCT 42.0 05/19/2015   MCV 88.8 05/19/2015   PLT 145* 05/19/2015     Recent Labs Lab 05/20/15 0745  NA 141  K 3.6  CL 103  CO2 26  BUN 17  CREATININE 0.88  CALCIUM 8.6*  PROT 6.0*  BILITOT 1.0  ALKPHOS 60  ALT 28  AST 32  GLUCOSE 146*   Lab Results  Component Value Date   TROPONINI 0.10* 05/20/2015   No results found for: CHOL No results found for: HDL No results  found for: LDLCALC No results found for: TRIG No results found for: CHOLHDL No results found for: LDLDIRECT    ASSESSMENT AND PLAN:   1. Wide complex tachycardia Due to atrial flutter with RVR The patient has chronic issues with atrial arrhythmias and atrial enlargement.  I anticipate that he will continue to have atrial arrhythmias going forward.  As he is not anticoagulated, rate control is our only real option at this point.  Given LA enlargement and known afib, I am not convinced that he would benefit from ablation. He is now better rate controlled. chads2vasc score is at least 6.  Today, we discussed eliquis as a reasonable option.  If we were to start anticoagulation, I would advise that we first stop plavix.  He is clear in his decision to decline initiation of anticoagualtion.  He states that he wishes to discuss this further with his primary cardiologist upon return to [place].  He is aware of stroke risks.  For rate control in the interim, I will start amiodarone 200mg  daily.  He is aware of potential for liver/thyroid/ lung toxicity and wishes to proceed.  I have instructed him to follow-up with his primary cardiologist in 2 weeks (upon  return to [place] for further direction).  2. CAD No ischemic symptoms presently Mild elevation in troponin is likely due to demand ischemia Cath from January reviewed No indication for further CV tesing here  3. Ischemic CM/ chronic systolic dysfunction euvolemic Resume home medicines Avoid salt, particularly while traveling  4. HTN Stable No change required today  As he appears stable, OK to discharge from EP standpoint.  I have offered follow-up in AF clinic this coming Tuesday which he declines.  He states that he would prefer to be seen by his primary cardiologist and says that he in fact has an appointment in 2 weeks.  I have given him the phone number to the AF clinic and have instructed him to call the clinic should any problems arise.  Electrophysiology team to see as needed while here. Please call with questions.    Hillis RangeJames Enrico Eaddy, MD 05/20/2015  4:24 PM

## 2015-05-20 NOTE — Progress Notes (Signed)
[facility] hospitalist progress note. Chief complaint. Transfer note. History of present illness. This 82 year old male presented to [facility] emergency room with complaints of heart fluttering. Patient likely A. fib with RVR. Case discussed with cardiology requested patient be transferred to [facility] for further cardiology evaluation. Patient is now arrived in transfer and I'm seeing him at bedside to ensure he remains clinically stable and that his orders transferred here appropriately. Patient has no current medical complaints. Physical exam. Vital signs. Temperature 97.3, pulse 87, respirations 17, blood pressure 154/83. O2 sats 98%. General appearance. Well-developed elderly male who is alert and in no distress. Cardiac. Rate and rhythm regular. Lungs. Breath sounds clear and equal. Abdomen. Soft with positive bowel sounds. Impression/plan. Problem #1. Congestive heart failure. Compensated at this time. Problem #2. Diabetes. Follow with sliding-scale. Problem #3. Atrial fib with RVR. Rate currently controlled. Problem #4. History Wolf Parkinson White syndrome. Problem #5. Hypomagnesemia. Replete. Patient appears clinically stable post transfer. All orders appear to of transferred here appropriately.

## 2015-05-20 NOTE — Consult Note (Signed)
CARDIOLOGY CONSULT NOTE   Patient ID: Donat Humble MRN: [id number] DOB/AGE: 1927/03/03 82 y.o.  Admit date: 05/19/2015  Requesting Physician: Tarry Kos Primary Physician:   Pcp Not In System Reason for Consultation:   WCT  HPI: Navin Dogan is a 82 y.o. 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 The pt is a very high-functioning 82yo, and he had been in his normal state of health until the afternoon of presentation when he experienced acute onset dyspnea and an "odd feeling" in his chest that was not a frank pain.  He presented to a local urgent care facility for evaluation but his symptoms had by then resolved and a preliminary w/u was negative.  In the car ride from the urgent care facility he experienced recurrent symptoms and subsequently presented to [facility] for evaluation.  In the ED at that facility he was noted to have intermittent runs of WCT, some with HR>180bpm, and with recurrent symptoms during these times.  He was otherwise hemodynamically stable and asymptomatic, but given his cardiac history he was transferred to [facility] for further evaluation.  Mr. Doubleday is currently resting comfortably in bed and has no acute complaints.  He denies any recent presyncopal or syncopal events, and has otherwise been feeling well prior to the onset of his presenting symptoms.  The patient's previous cardiac workup and diagnosis, including those documented above, were obtained with the Care Everyone feature with documentation from [facility] in [place].   Past Medical History  Diagnosis Date  . CHF (congestive heart failure) (HCC)   . Hypertension   . Diabetes mellitus  without complication (HCC)   . Thyroid disease   . Atrial fibrillation (HCC)   . History of Wolff-Parkinson-White (WPW) syndrome     s/p ablation  . Coronary artery disease   . GI bleed      Past Surgical History  Procedure Laterality Date  . Angioplasty    . Heart ablation      for WPW  . Stents    . Cardiac catheterization  01/2015    [name]    Allergies  Allergen Reactions  . Pradaxa [Dabigatran Etexilate Mesylate] Other (See Comments)    black-tar stools  . Sulfa Antibiotics Hives and Rash    I have reviewed the patient's current medications . sodium chloride   Intravenous STAT  . amLODipine  2.5 mg Oral q morning - 10a  . aspirin EC  325 mg Oral Daily  . atorvastatin  40 mg Oral QPM  . clopidogrel  75 mg Oral q morning - 10a  . furosemide  40 mg Oral BID  . isosorbide mononitrate  60 mg Oral q morning - 10a  . levothyroxine  100 mcg Oral QAC breakfast  . lisinopril  40 mg Oral q morning - 10a  . metoprolol  100 mg Oral BID  . vitamin C  500 mg Oral Daily     acetaminophen, ondansetron (ZOFRAN) IV  Prior to Admission medications   Medication Sig Start Date End Date Taking? Authorizing Provider  amLODipine (NORVASC) 2.5 MG tablet Take 2.5 mg by mouth every morning.   Yes Historical Provider, MD  atorvastatin (LIPITOR) 40 MG tablet Take 40 mg by mouth every evening.  Yes Historical Provider, MD  clopidogrel (PLAVIX) 75 MG tablet Take 75 mg by mouth every morning.   Yes Historical Provider, MD  furosemide (LASIX) 40 MG tablet Take 40 mg by mouth 2 (two) times daily.   Yes Historical Provider, MD  isosorbide mononitrate (IMDUR) 60 MG 24 hr tablet Take 60 mg by mouth every morning.   Yes Historical Provider, MD  levothyroxine (SYNTHROID, LEVOTHROID) 100 MCG tablet Take 100 mcg by mouth daily before breakfast.   Yes Historical Provider, MD  lisinopril (PRINIVIL,ZESTRIL) 40 MG tablet Take 40 mg by mouth every morning.   Yes Historical Provider, MD  metFORMIN  (GLUCOPHAGE) 500 MG tablet Take 500 mg by mouth 2 (two) times daily with a meal.   Yes Historical Provider, MD  metoprolol (LOPRESSOR) 100 MG tablet Take 100 mg by mouth 2 (two) times daily.   Yes Historical Provider, MD  Multiple Vitamins-Minerals (CENTRUM PO) Take 1 tablet by mouth daily.   Yes Historical Provider, MD  vitamin C (ASCORBIC ACID) 500 MG tablet Take 500 mg by mouth daily.   Yes Historical Provider, MD     Social History   Social History  . Marital Status: Widowed    Spouse Name: N/A  . Number of Children: N/A  . Years of Education: N/A   Occupational History  . Not on file.   Social History Main Topics  . Smoking status: Former Games developermoker  . Smokeless tobacco: Not on file  . Alcohol Use: No  . Drug Use: No  . Sexual Activity: Not on file   Other Topics Concern  . Not on file   Social History Narrative  . No narrative on file    No family status information on file.   History reviewed. No pertinent family history.   ROS:  Full 14 point review of systems complete and found to be negative unless listed above.  Physical Exam: Blood pressure 154/83, pulse 87, temperature 97.3 F (36.3 C), temperature source Oral, resp. rate 17, height 5\' 9"  (1.753 m), weight 69.809 kg (153 lb 14.4 oz), SpO2 98 %.  General: Well developed, well nourished, male in no acute distress Head: Eyes PERRLA, No xanthomas.   Normocephalic and atraumatic, oropharynx without edema or exudate. Dentition:  Lungs:  Heart: HRRR S1 S2, no rub/gallop, Heart irregular rate and rhythm with S1, S2  murmur. pulses are 2+ extrem.   Neck: No carotid bruits. No lymphadenopathy.  JVD. Abdomen: Bowel sounds present, abdomen soft and non-tender without masses or hernias noted. Msk:  No spine or cva tenderness. No weakness, no joint deformities or effusions. Extremities: No clubbing or cyanosis.  edema.  Neuro: Alert and oriented X 3. No focal deficits noted. Psych:  Good affect, responds  appropriately Skin: No rashes or lesions noted.  Labs:   Lab Results  Component Value Date   WBC 7.4 05/19/2015   HGB 14.4 05/19/2015   HCT 42.0 05/19/2015   MCV 88.8 05/19/2015   PLT 145* 05/19/2015   No results for input(s): INR in the last 72 hours.  Recent Labs Lab 05/19/15 2026  NA 140  K 3.8  CL 103  CO2 26  BUN 24*  CREATININE 1.00  CALCIUM 9.2  PROT 6.5  BILITOT 1.0  ALKPHOS 62  ALT 32  AST 40  GLUCOSE 183*  ALBUMIN 4.0   MAGNESIUM  Date Value Ref Range Status  05/19/2015 1.6* 1.7 - 2.4 mg/dL Final    Recent Labs  30/86/5704/21/17 2026 05/20/15 0203  TROPONINI  0.04* 0.11*  0.10*   No results for input(s): TROPIPOC in the last 72 hours. No results found for: PROBNP No results found for: CHOL, HDL, LDLCALC, TRIG No results found for: DDIMER No results found for: LIPASE, AMYLASE No results found for: TSH, T4TOTAL, T3FREE, THYROIDAB No results found for: VITAMINB12, FOLATE, FERRITIN, TIBC, IRON, RETICCTPCT  Echo [facility]) 02/11/2014:  Moderately dilated left ventricle. Severe left ventricular dysfuncion. Estimated ejection fraction is (conduction delay). Overall- there is a severe global hypokinesis. Mild concentric left ventricular hypertrophy. Severe left atrial enlargement. .Right ventricle and right atrium are mildly enlarged . Right ventricle is mildly hypokinetic. Mitral valve leaflets are thickened. There is a moderate, centrally directing mitral regurgitation. Calcified aortic valve with a moderate aortic regurgitation.  Moderate tricuspid and pulmonic regurgitation. Moderate pulmonary hypertension. Aortic root and ascending aorta are mildly enlarged. Mild ascites noted.    Coronary Angiography [facility]) 02/10/2015 Coronary Angiography:   1. Left Main Trunk (LMT): Large caliber vessel with mild calcification and diffuse 10% distal stenosis  2. Left Anterior Descending Artery (LAD): Medium caliber calcified  vessel with proximal diffuse 20-30% stenosis. The mid LAD has a widely patent previously placed stent with mild in-stent restenosis. The LAD beyond the stent has a discrete 40% stenosis followed by luminal irregularities in a transapical vessel. Diagonal 1 is a small caliber branching vessel that is jailed by the previous placed stent and has a severe 80% ostial stenosis.   3. Left Circumflex Artery (LCx): Medium caliber calcified proximal vessel with diffuse stenosis ranging from 20-40%. Just distal to the takeoff of OM 2 in the AV groove portion of the circumflex there is a discrete 50-60% stenosis. OM 2 is a medium caliber branching vessel with luminal irregularities.  4. Right Coronary Artery (RCA): Proximal to mid vessel has mild calcification with diffuse 10-30% stenosis. The RPDA is small caliber with luminal irregularities throughout   ECG: multiple tracings reviewed without clear evidence of sinus rhythm.  LBBB stable throughout all tracings including during periods of extreme tachycardia and without axis change, thus WCT unconcerning for VT. This period of WCT is consistent with SVT w/ aberrancy.  Baseline rhythm appears to be an ectopic atrial rhythm with p-p interval of and 2:1 A-V conduction.  There are not clear flutter waves per my review, and tracing from 20:16:32 appears to the same underlying rhythm interrupted by frequent PVCs, not atrial fibrillation.  Radiology:  Dg Chest Port 1 View  05/19/2015  CLINICAL DATA:  Palpitations and diaphoresis. EXAM: PORTABLE CHEST 1 VIEW COMPARISON:  None. FINDINGS: Slightly shallow inspiration. Cardiac enlargement without vascular congestion. No focal airspace disease or consolidation in the lungs. No blunting of costophrenic angles. No pneumothorax. Calcification of the aorta. IMPRESSION: Cardiac enlargement.  No evidence of active pulmonary disease. Electronically Signed   By: Burman Nieves M.D.   On: 05/19/2015 22:24   This patients  CHA2DS2-VASc Score and unadjusted Ischemic Stroke Rate (% per year) is equal to 9.7 % stroke rate/year from a score of 6  Above score calculated as 1 point each if present [CHF, HTN, DM, Vascular=MI/PAD/Aortic Plaque, Age if 65-74, or Male] Above score calculated as 2 points each if present [Age > 75, or Stroke/TIA/TE]   ASSESSMENT: - Wide-complex tachycardia - CAD - ICM  - chronic systolic heart failure - paroxysmal atrial fibrillation - hypomagnesemia  PLAN:    The pt's period of WCT is consistent with an SVT w/ aberrant conduction in the  setting of a chronic LBBB.  He has known history of Afib/Aflutter, and significantly dilated LA per OSH TTE documented above.  Recommend continuing current medical regimen as per below, but if symptomatic WCT recurs then suggest amiodarone.  Of note, pt has a close relationship with his primary cardiologist in [place] with scheduled follow-up in the coming weeks. - continuous telemetry - stat ECG if WCT recurs, and in this case would suggest Amiodarone 150mg  IV x 1 and then amio gtt as per protocol - would avoid diltiazem for rate-control given known reduced LVEF - continue Metoprolol 100mg  PO BID with first dose now - pt has a strong indication for systemic anticoagulation given CHADs2VASc score of 6 as per above.  This has been deferred by the pt's primary cardiologist 2/2 previous GIB.  For now would continue ASA and Clopidogrel - continue atorvastatin, furosemide, imdur, lisinopril, and spironolactone at current dosing schedule   - maintain K>4 and Mg>2  Signed: Azalee Course, MD 05/20/2015 3:52 AM

## 2015-05-21 DIAGNOSIS — I5042 Chronic combined systolic (congestive) and diastolic (congestive) heart failure: Secondary | ICD-10-CM | POA: Diagnosis present

## 2015-05-21 NOTE — Discharge Summary (Signed)
Physician Discharge Summary  James ChouJohn Medearis ZOX:[id number] DOB: [birth year] DOA: 05/19/2015  PCP: Pcp Not In System  Admit date: 05/19/2015 Discharge date: 05/21/2015  Time spent: > 30 minutes  Recommendations for Outpatient Follow-up:  1. Follow up with primary cardiologist in [place] in 1 week   Discharge Diagnoses:  Principal Problem:   Fluttering sensation of heart Active Problems:   Diabetes mellitus without complication (HCC)   Atrial fibrillation (HCC)   History of Wolff-Parkinson-White (WPW) syndrome   Atrial fibrillation with rapid ventricular response (HCC)   Hypomagnesemia   Chronic combined systolic and diastolic congestive heart failure (HCC)   Discharge Condition: stable  Diet recommendation: heart healthy  Filed Weights   05/19/15 2012 05/20/15 0206  Weight: 70.308 kg (155 lb) 69.809 kg (153 lb 14.4 oz)    History of present illness:  Per Dr. Onalee Evans 82 yo male h/o afib/LBBB, chf, htn, hypothyroidism, s/p ablation for unknown arrythmia (pt not aware of dx of WPW) comes in today after experiencing a fluttering sensation in his chest with associated diaphoresis and sob. There was really no chest pain. Pt is visiting from [place]. He has experienced theses symptoms before and he says it is due to his irregular heartbeat. Pt HR was over 160, spontaneously resolved in the ED and rate now in the 80s pt asymptomatic at this time. Pt denies any fever. No swelling. Pt referred for admission for his arrythmia. [facility] for consult, pt will be transferred to [facility] for cardiac consult, pt aware of this.  Hospital Course:  Patient was admitted to the hospital with wide complex tachycardia. Cardiology was consulted and evaluated patient while hospitalized, and recommended Amiodarone be added to his regimen, which was prescribed on discharge. He has A fib with chads2vasc score of at least 6 and would benefit from anticoagulation. He was taking Pradaxa however  had a GI bleed few years back. Cardiology discussed with patient to resume anticoagulation however patient declined and would like to touch base with his primary cardiologist in [place]. He has an appointment set up in ~2 weeks. I discussed with Dr. Johney Evans, no need for further inpatient testing, he is stable for discharge, asymptomatic and feels back to baseline. His other medical problems as above have been stable and no other changes were made to his chronic medication regimen.   Procedures:  None    Consultations:  Cardiology / EP, Dr. Johney Evans  Discharge Exam: Filed Vitals:   05/20/15 0000 05/20/15 0030 05/20/15 0206 05/20/15 1247  BP: 140/89 151/84 154/83 115/82  Pulse: 88 87 87 76  Temp:   97.3 F (36.3 C) 98.7 F (37.1 C)  TempSrc:   Oral Oral  Resp: 16 18 17 20   Height:   5\' 9"  (1.753 m)   Weight:   69.809 kg (153 lb 14.4 oz)   SpO2: 97% 96% 98% 100%    General: NAD Cardiovascular: irregular Respiratory: CTA biL  Discharge Instructions Activity:  As tolerated   Get Medicines reviewed and adjusted: Please take all your medications with you for your next visit with your Primary MD  Please request your Primary MD to go over all hospital tests and procedure/radiological results at the follow up, please ask your Primary MD to get all Hospital records sent to his/her office.  If you experience worsening of your admission symptoms, develop shortness of breath, life threatening emergency, suicidal or homicidal thoughts you must seek medical attention immediately by calling 911 or calling your MD immediately if symptoms  less severe.  You must read complete instructions/literature along with all the possible adverse reactions/side effects for all the Medicines you take and that have been prescribed to you. Take any new Medicines after you have completely understood and accpet all the possible adverse reactions/side effects.   Do not drive when taking Pain medications.   Do  not take more than prescribed Pain, Sleep and Anxiety Medications  Special Instructions: If you have smoked or chewed Tobacco in the last 2 yrs please stop smoking, stop any regular Alcohol and or any Recreational drug use.  Wear Seat belts while driving.  Please note  You were cared for by a hospitalist during your hospital stay. Once you are discharged, your primary care physician will handle any further medical issues. Please note that NO REFILLS for any discharge medications will be authorized once you are discharged, as it is imperative that you return to your primary care physician (or establish a relationship with a primary care physician if you do not have one) for your aftercare needs so that they can reassess your need for medications and monitor your lab values.    Medication List    TAKE these medications        amiodarone 200 MG tablet  Commonly known as:  PACERONE  Take 1 tablet (200 mg total) by mouth daily.     amLODipine 2.5 MG tablet  Commonly known as:  NORVASC  Take 2.5 mg by mouth every morning.     atorvastatin 40 MG tablet  Commonly known as:  LIPITOR  Take 40 mg by mouth every evening.     CENTRUM PO  Take 1 tablet by mouth daily.     clopidogrel 75 MG tablet  Commonly known as:  PLAVIX  Take 75 mg by mouth every morning.     furosemide 40 MG tablet  Commonly known as:  LASIX  Take 40 mg by mouth 2 (two) times daily.     isosorbide mononitrate 60 MG 24 hr tablet  Commonly known as:  IMDUR  Take 60 mg by mouth every morning.     levothyroxine 100 MCG tablet  Commonly known as:  SYNTHROID, LEVOTHROID  Take 100 mcg by mouth daily before breakfast.     lisinopril 40 MG tablet  Commonly known as:  PRINIVIL,ZESTRIL  Take 40 mg by mouth every morning.     metFORMIN 500 MG tablet  Commonly known as:  GLUCOPHAGE  Take 500 mg by mouth 2 (two) times daily with a meal.     metoprolol 100 MG tablet  Commonly known as:  LOPRESSOR  Take 100 mg by  mouth 2 (two) times daily.     vitamin C 500 MG tablet  Commonly known as:  ASCORBIC ACID  Take 500 mg by mouth daily.         The results of significant diagnostics from this hospitalization (including imaging, microbiology, ancillary and laboratory) are listed below for reference.    Significant Diagnostic Studies: Dg Chest Port 1 View  05/19/2015  CLINICAL DATA:  Palpitations and diaphoresis. EXAM: PORTABLE CHEST 1 VIEW COMPARISON:  None. FINDINGS: Slightly shallow inspiration. Cardiac enlargement without vascular congestion. No focal airspace disease or consolidation in the lungs. No blunting of costophrenic angles. No pneumothorax. Calcification of the aorta. IMPRESSION: Cardiac enlargement.  No evidence of active pulmonary disease. Electronically Signed   By: Burman Nieves M.D.   On: 05/19/2015 22:24    Microbiology: No results found for this or any previous  visit (from the past 240 hour(s)).   Labs: Basic Metabolic Panel:  Recent Labs Lab 05/19/15 2026 05/20/15 0745  NA 140 141  K 3.8 3.6  CL 103 103  CO2 26 26  GLUCOSE 183* 146*  BUN 24* 17  CREATININE 1.00 0.88  CALCIUM 9.2 8.6*  MG 1.6* 1.9   Liver Function Tests:  Recent Labs Lab 05/19/15 2026 05/20/15 0745  AST 40 32  ALT 32 28  ALKPHOS 62 60  BILITOT 1.0 1.0  PROT 6.5 6.0*  ALBUMIN 4.0 3.7   No results for input(s): LIPASE, AMYLASE in the last 168 hours. No results for input(s): AMMONIA in the last 168 hours. CBC:  Recent Labs Lab 05/19/15 2026  WBC 7.4  NEUTROABS 4.6  HGB 14.4  HCT 42.0  MCV 88.8  PLT 145*   Cardiac Enzymes:  Recent Labs Lab 05/19/15 2026 05/20/15 0203 05/20/15 0745  TROPONINI 0.04* 0.11*  0.10* 0.10*   BNP: BNP (last 3 results)  Recent Labs  05/19/15 2026  BNP 689.0*    CBG:  Recent Labs Lab 05/20/15 0658 05/20/15 1122 05/20/15 1559  GLUCAP 134* 129* 95    Signed:  Pamella Pert  [facility] 05/21/2015, 2:18 PM

## 2018-01-25 IMAGING — CR DG CHEST 1V PORT
1 series · 1 of 1 positions shown · non-contrast
Comparison: None.

CLINICAL DATA: Palpitations and diaphoresis.

EXAM:
PORTABLE CHEST 1 VIEW

[ap portable]
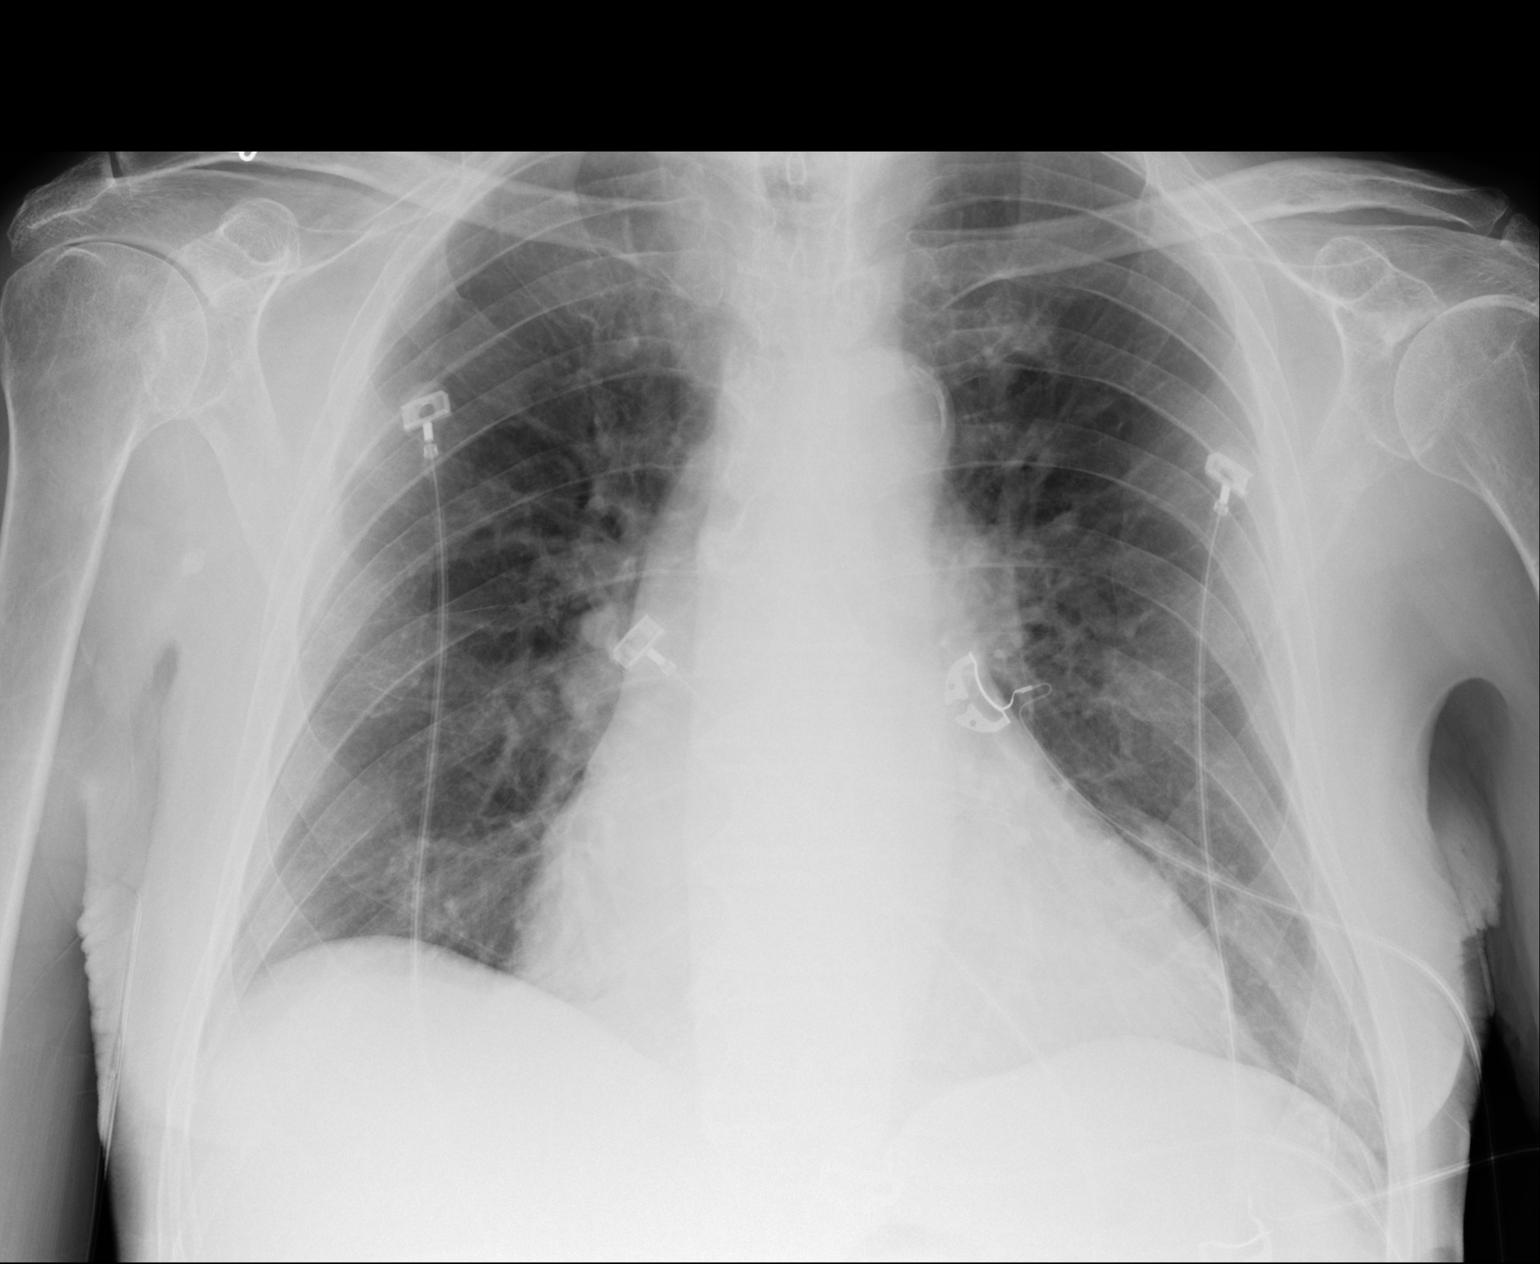

[1 of 1 positions shown; findings below may reference images not displayed]

FINDINGS: Slightly shallow inspiration. Cardiac enlargement without vascular
congestion. No focal airspace disease or consolidation in the lungs.
No blunting of costophrenic angles. No pneumothorax. Calcification
of the aorta.
IMPRESSION: Cardiac enlargement.  No evidence of active pulmonary disease.

## 2019-04-29 DEATH — deceased
